# Patient Record
Sex: Male | Born: 2000 | Race: Black or African American | Hispanic: No | Marital: Single | Smoking: Never smoker
Health system: Southern US, Community
[De-identification: ages and names within clinical notes are randomized; demographics above are authoritative.]

---
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
## 2019-11-21 IMAGING — CT CT RENAL STONE PROTOCOL
2 of 4 series · 17 of 46 positions shown, 19 images · non-contrast
Comparison: None.

CLINICAL DATA: Hematuria since [REDACTED]. Dysuria.

EXAM:
CT ABDOMEN AND PELVIS WITHOUT CONTRAST
TECHNIQUE: Multidetector CT imaging of the abdomen and pelvis was performed
following the standard protocol without IV contrast.

[Series 2: stone full standard · axial · 0.75mm/px · z∈[-836,-381]mm · 14 of 101 slices shown, 16 images]
[im 5/101  soft-tissue]
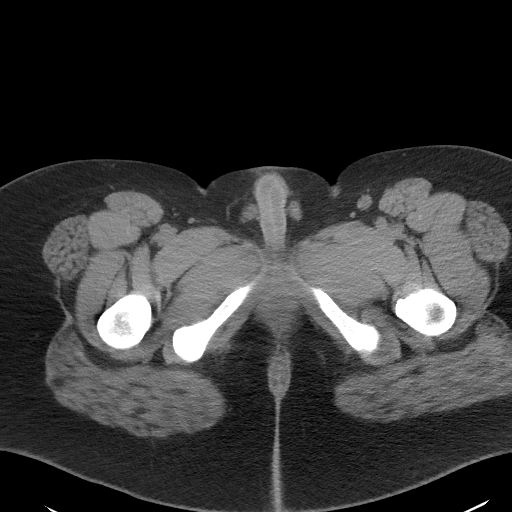
[im 5/101  bone]
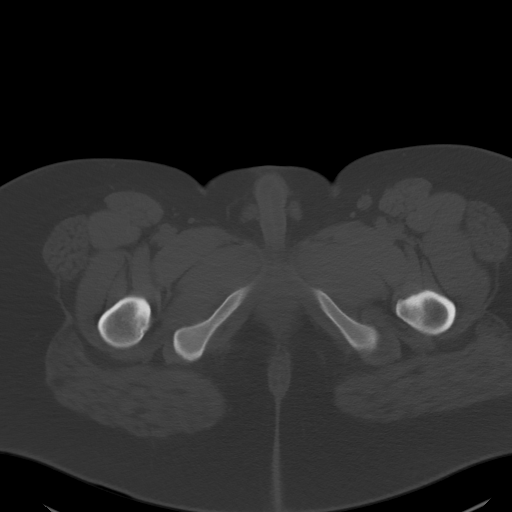
[im 13/101  soft-tissue]
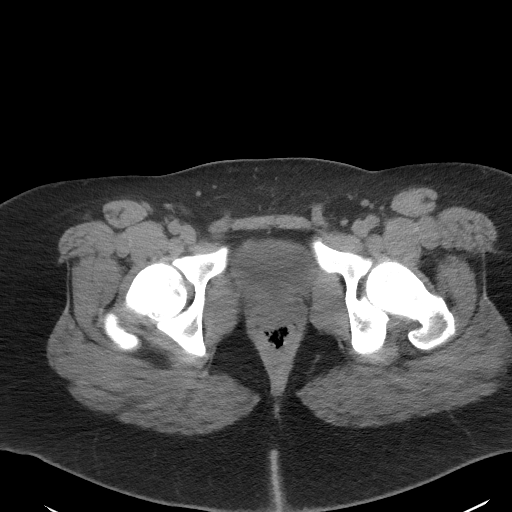
[im 21/101  soft-tissue]
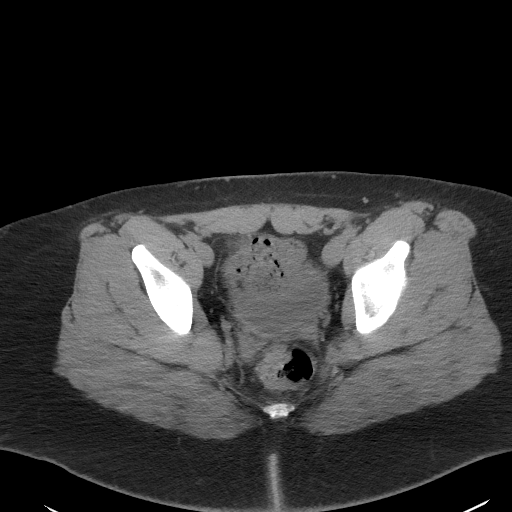
[im 26/101  soft-tissue]
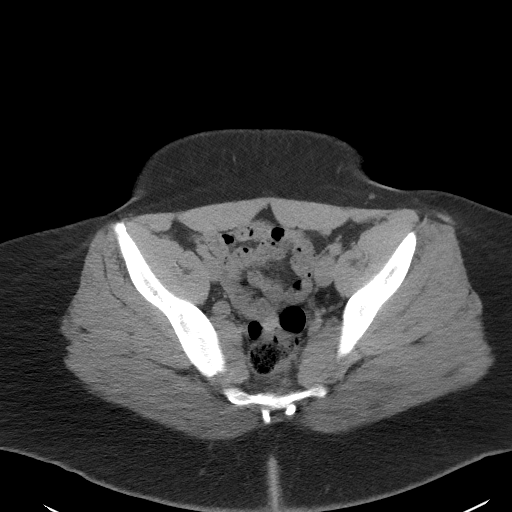
[im 34/101  soft-tissue]
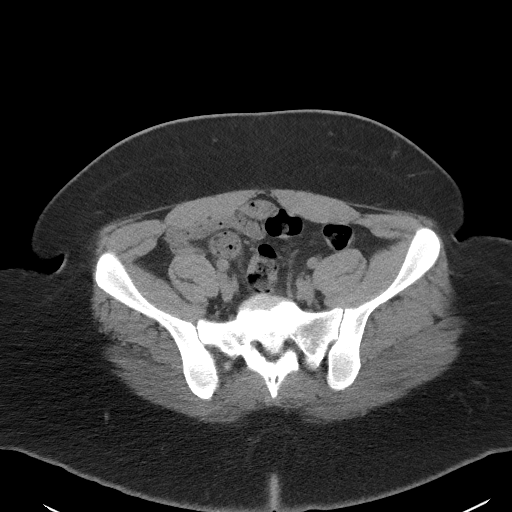
[im 42/101  soft-tissue]
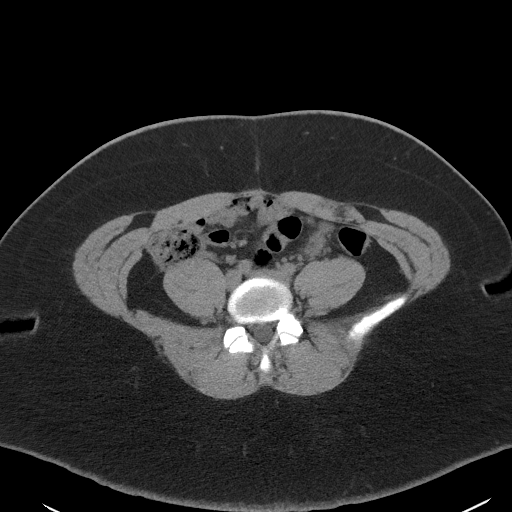
[im 46/101  soft-tissue]
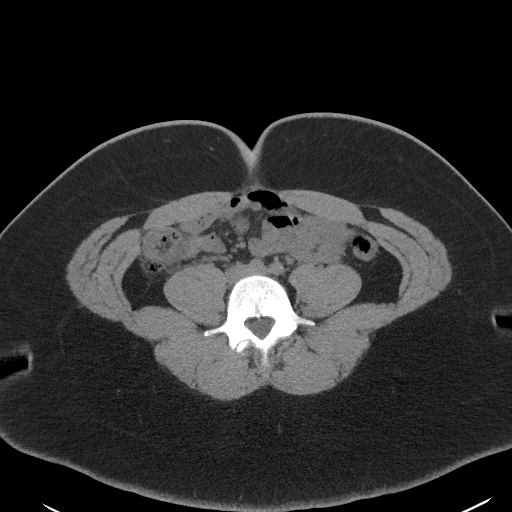
[im 55/101  soft-tissue]
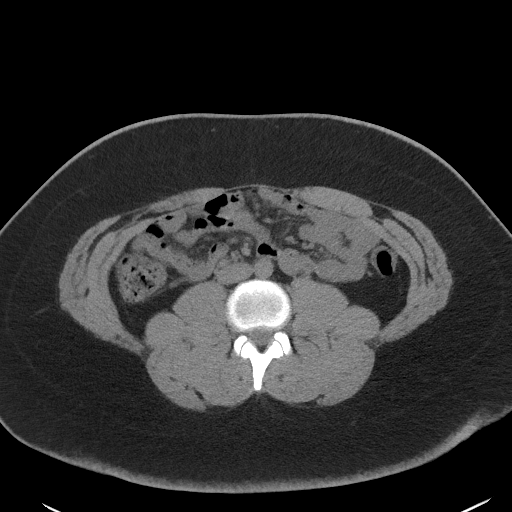
[im 59/101  soft-tissue]
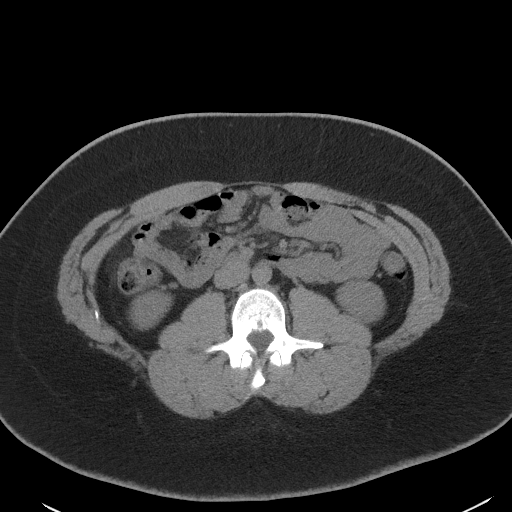
[im 59/101  bone]
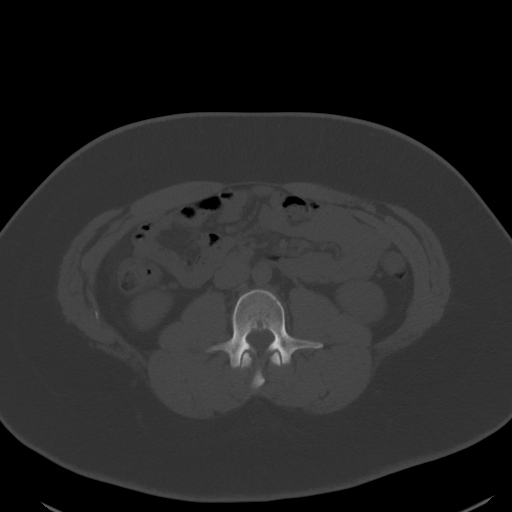
[im 67/101  soft-tissue]
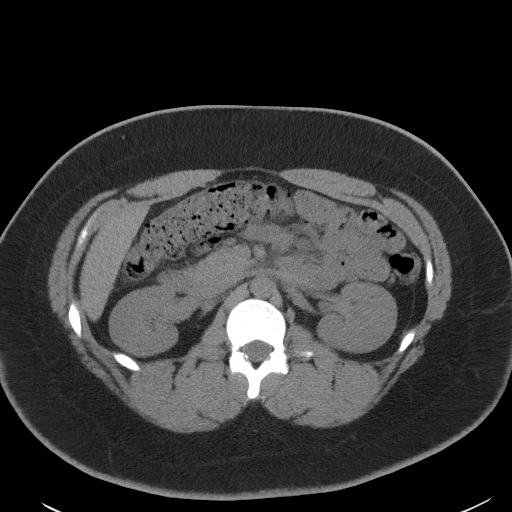
[im 76/101  soft-tissue]
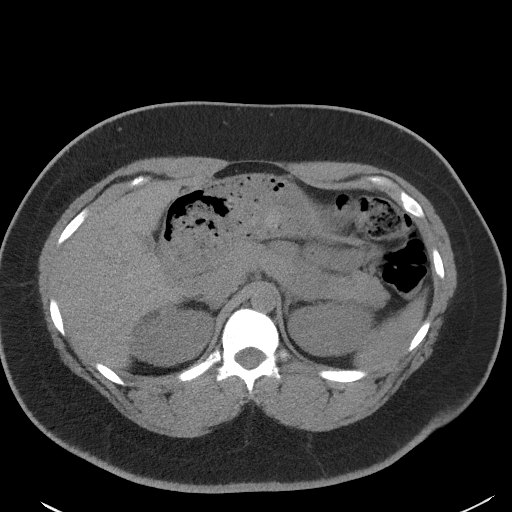
[im 80/101  soft-tissue]
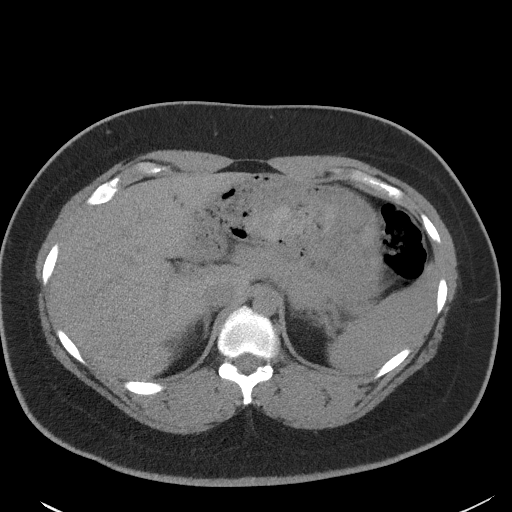
[im 88/101  soft-tissue]
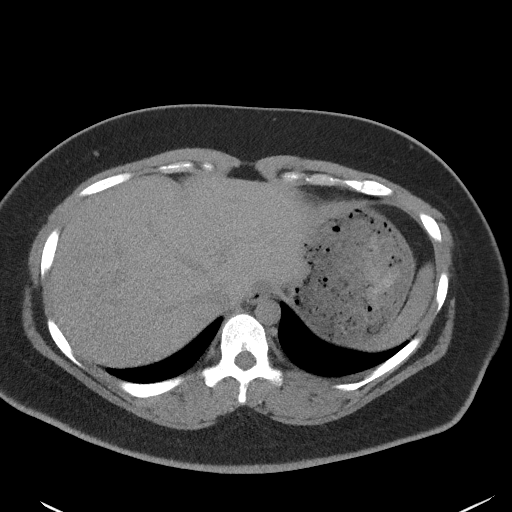
[im 96/101  soft-tissue]
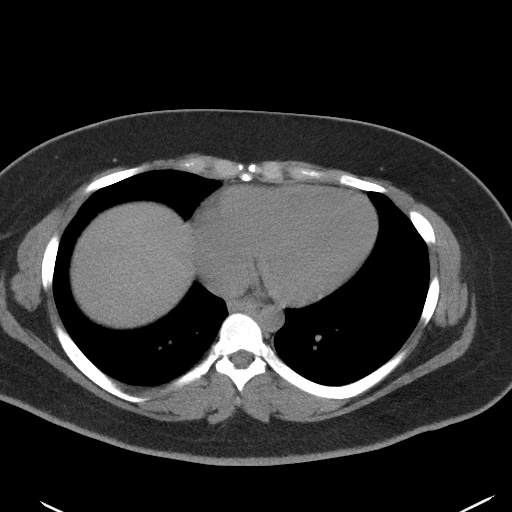

[Series 5: coronal · coronal · 0.87mm/px · 3 of 134 slices shown]
[im 45/134  soft-tissue]
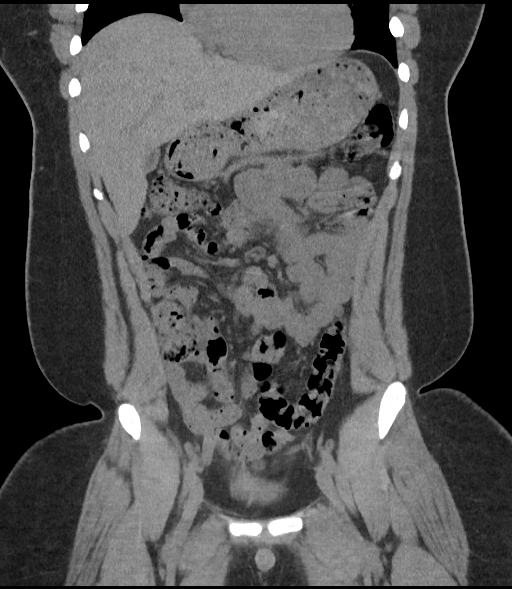
[im 60/134  soft-tissue]
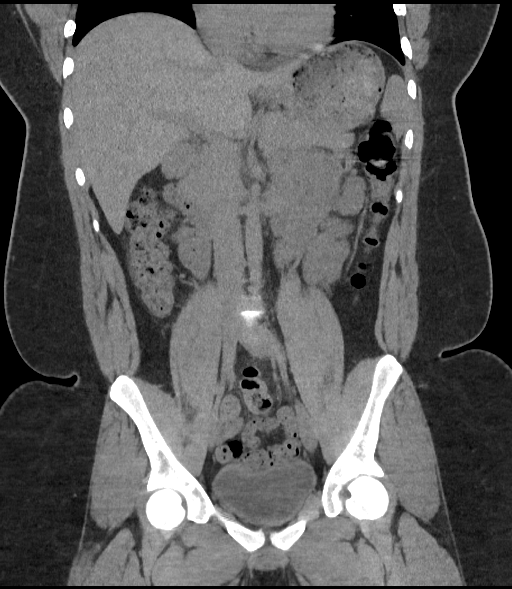
[im 74/134  soft-tissue]
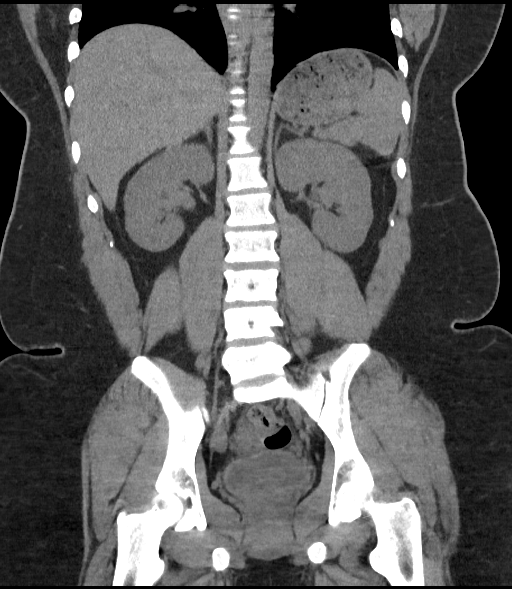

[17 of 46 positions shown; findings below may reference images not displayed]

FINDINGS: Lower chest: Clear lung bases. Normal heart size without pericardial
or pleural effusion.

Hepatobiliary: Normal liver. Normal gallbladder, without biliary
ductal dilatation.

Pancreas: Normal, without mass or ductal dilatation.

Spleen: Normal in size, without focal abnormality.

Adrenals/Urinary Tract: Normal adrenal glands. No renal calculi or
hydronephrosis. No hydroureter or ureteric calculi. No bladder
calculi.

Stomach/Bowel: Normal stomach, without wall thickening. Normal colon
and terminal ileum. Normal appendix. Normal small bowel.

Vascular/Lymphatic: Normal caliber of the aorta and branch vessels.
No abdominopelvic adenopathy.

Reproductive: Normal prostate.

Other: No significant free fluid.

Musculoskeletal: No acute osseous abnormality.
IMPRESSION: 1. No urinary tract calculi or hydronephrosis.
2. No other explanation for patient's symptoms.
